# Patient Record
Sex: Female | Born: 1970 | Race: White | Hispanic: No | Marital: Married | Smoking: Never smoker
Health system: Southern US, Community
[De-identification: ages and names within clinical notes are randomized; demographics above are authoritative.]

## PROBLEM LIST (undated history)

## (undated) HISTORY — PX: SPLENECTOMY: SUR1306

## (undated) HISTORY — PX: TUBAL LIGATION: SHX77

## (undated) HISTORY — PX: BUNIONECTOMY: SHX129

---
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
## 2002-02-19 ENCOUNTER — Other Ambulatory Visit: Admission: RE | Admit: 2002-02-19 | Discharge: 2002-02-19 | Payer: Self-pay | Admitting: Obstetrics and Gynecology

## 2003-02-21 ENCOUNTER — Other Ambulatory Visit: Admission: RE | Admit: 2003-02-21 | Discharge: 2003-02-21 | Payer: Self-pay | Admitting: Obstetrics and Gynecology

## 2004-02-27 ENCOUNTER — Other Ambulatory Visit: Admission: RE | Admit: 2004-02-27 | Discharge: 2004-02-27 | Payer: Self-pay | Admitting: Obstetrics and Gynecology

## 2004-10-15 ENCOUNTER — Ambulatory Visit (HOSPITAL_COMMUNITY): Admission: RE | Admit: 2004-10-15 | Discharge: 2004-10-15 | Payer: Self-pay | Admitting: Obstetrics and Gynecology

## 2005-02-11 ENCOUNTER — Other Ambulatory Visit: Admission: RE | Admit: 2005-02-11 | Discharge: 2005-02-11 | Payer: Self-pay | Admitting: Obstetrics and Gynecology

## 2005-04-11 ENCOUNTER — Encounter (INDEPENDENT_AMBULATORY_CARE_PROVIDER_SITE_OTHER): Payer: Self-pay | Admitting: *Deleted

## 2005-04-11 ENCOUNTER — Ambulatory Visit (HOSPITAL_COMMUNITY): Admission: RE | Admit: 2005-04-11 | Discharge: 2005-04-11 | Payer: Self-pay | Admitting: Obstetrics and Gynecology

## 2006-02-13 ENCOUNTER — Other Ambulatory Visit: Admission: RE | Admit: 2006-02-13 | Discharge: 2006-02-13 | Payer: Self-pay | Admitting: Obstetrics and Gynecology

## 2008-12-15 ENCOUNTER — Ambulatory Visit: Payer: Self-pay | Admitting: Diagnostic Radiology

## 2008-12-15 ENCOUNTER — Emergency Department (HOSPITAL_BASED_OUTPATIENT_CLINIC_OR_DEPARTMENT_OTHER): Admission: EM | Admit: 2008-12-15 | Discharge: 2008-12-16 | Payer: Self-pay | Admitting: Emergency Medicine

## 2009-10-11 IMAGING — CT CT HEAD W/O CM
1 series · 16 of 30 positions shown, 20 images · non-contrast
Comparison: None

CLINICAL DATA: Headache.  Syncope.  Nausea.

CT HEAD WITHOUT CONTRAST
TECHNIQUE: Contiguous axial images were obtained from the base of
the skull through the vertex without contrast

[Series 2: head 4.8 h37s · axial · 0.42mm/px · z∈[+1026,+1163]mm · 16 of 32 slices shown, 20 images]
[im 2/32  brain]
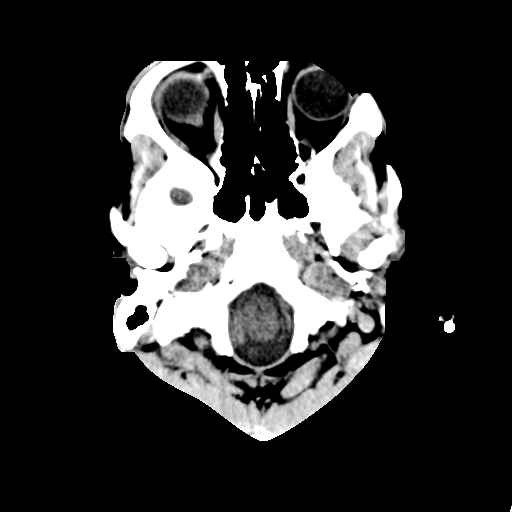
[im 2/32  bone]
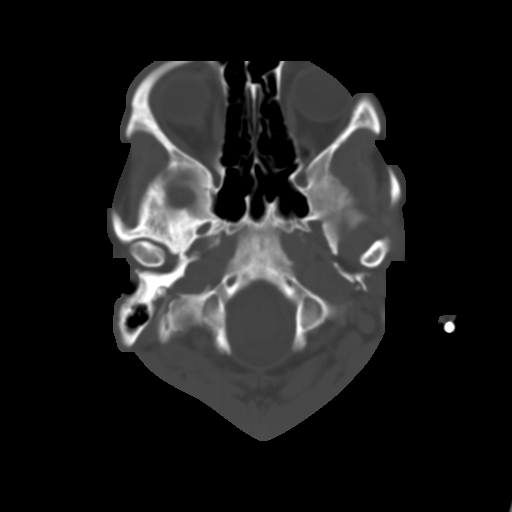
[im 4/32  brain]
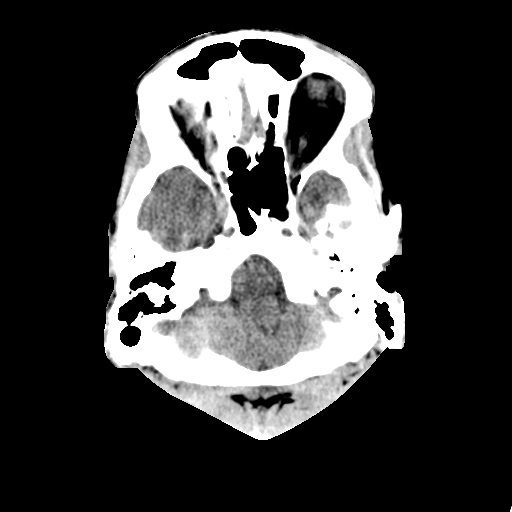
[im 6/32  brain]
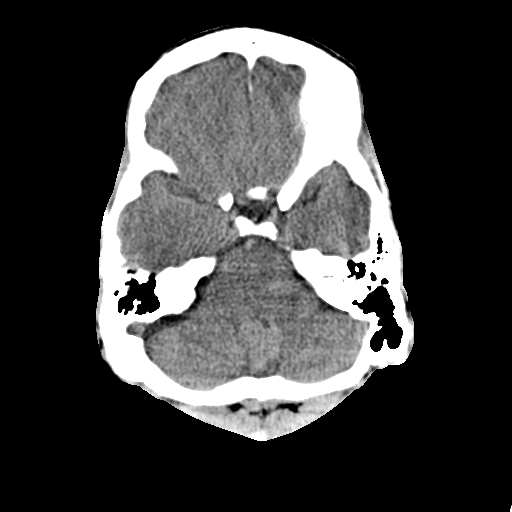
[im 8/32  brain]
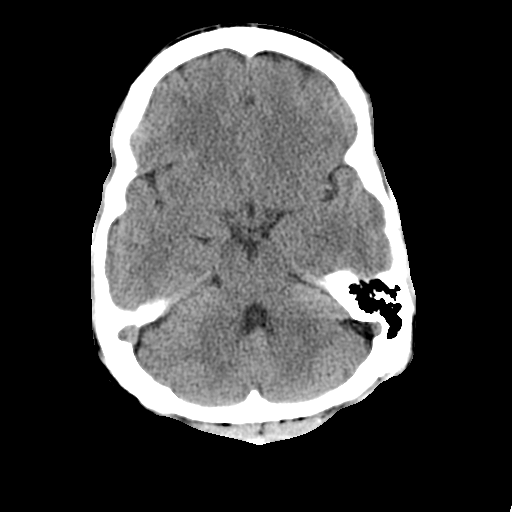
[im 9/32  brain]
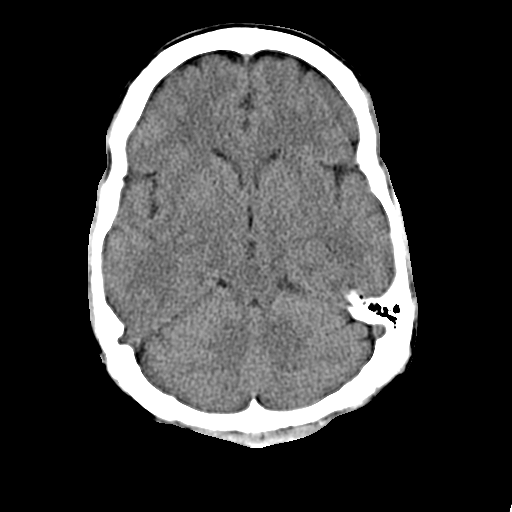
[im 9/32  bone]
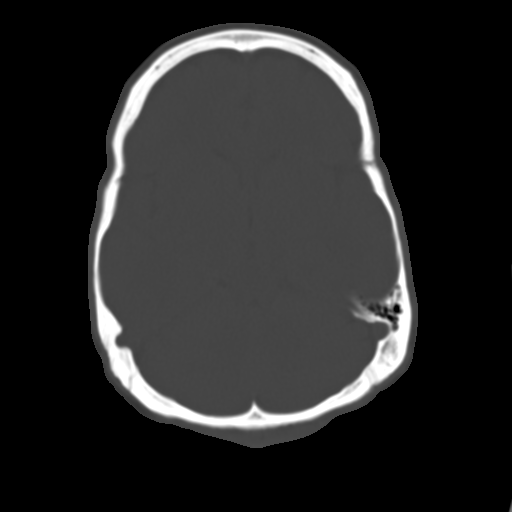
[im 11/32  brain]
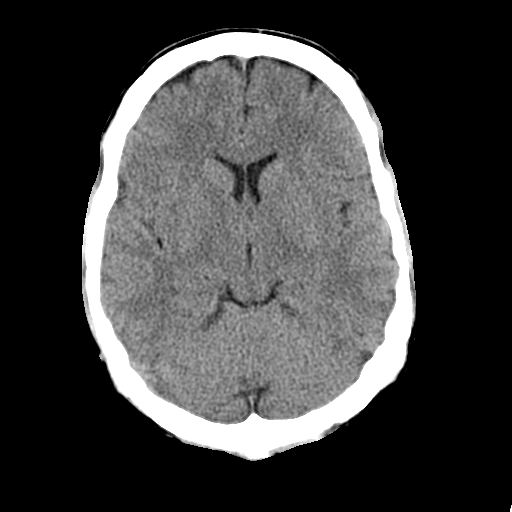
[im 13/32  brain]
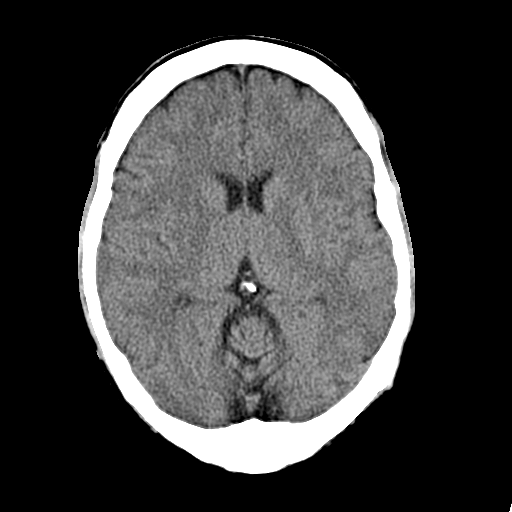
[im 15/32  brain]
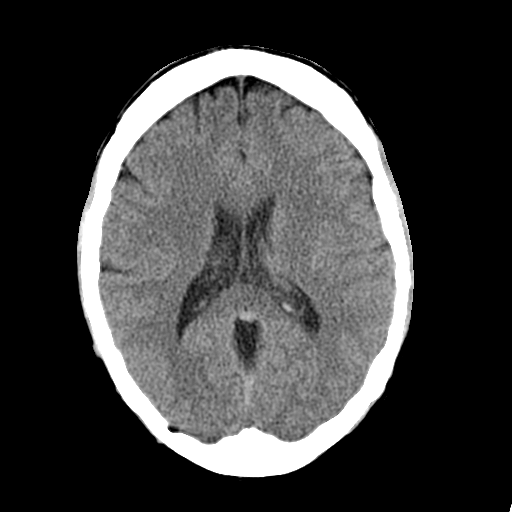
[im 17/32  brain]
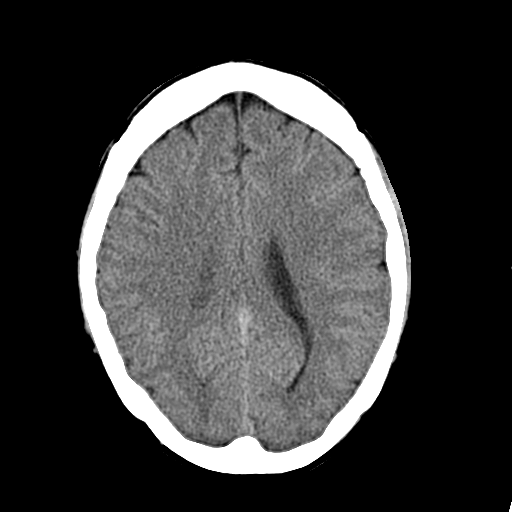
[im 17/32  bone]
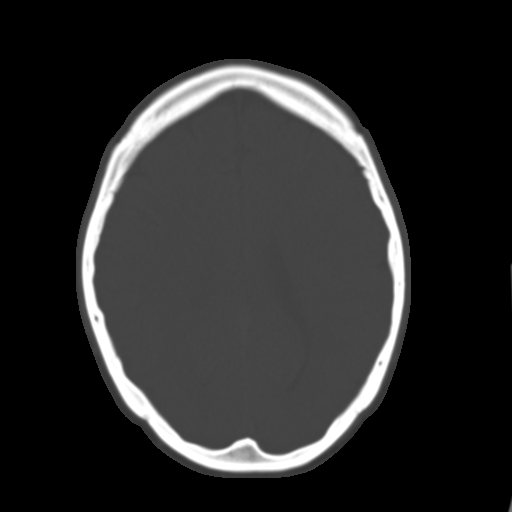
[im 19/32  brain]
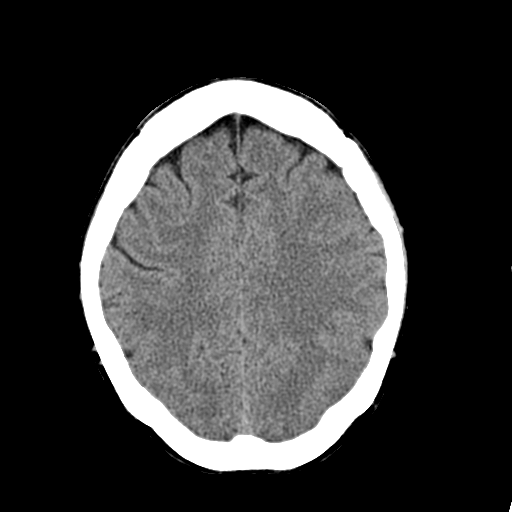
[im 21/32  brain]
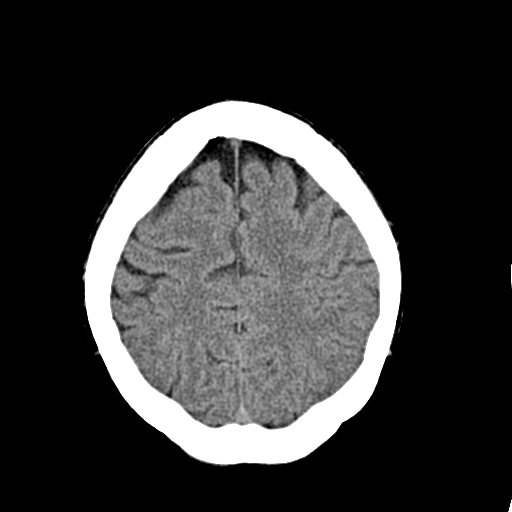
[im 23/32  brain]
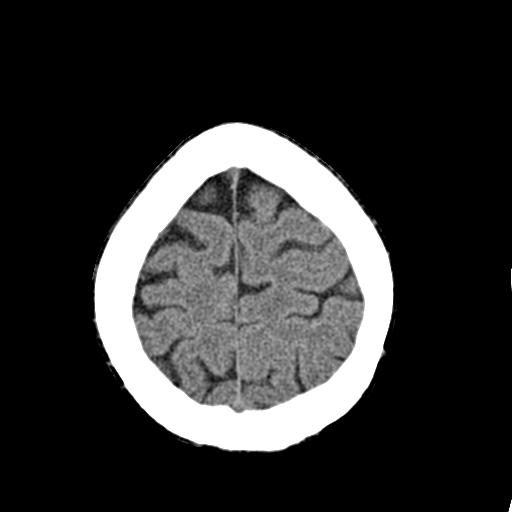
[im 24/32  brain]
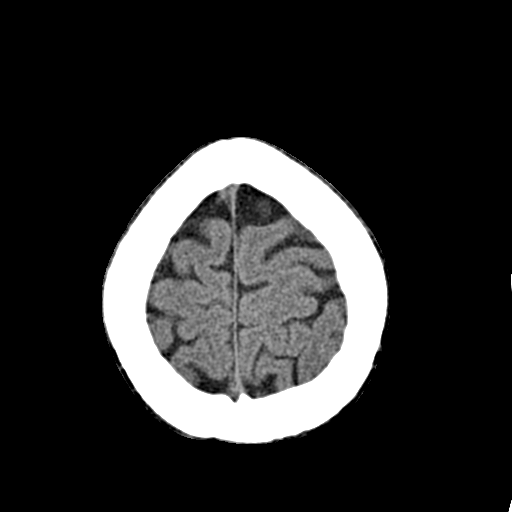
[im 24/32  bone]
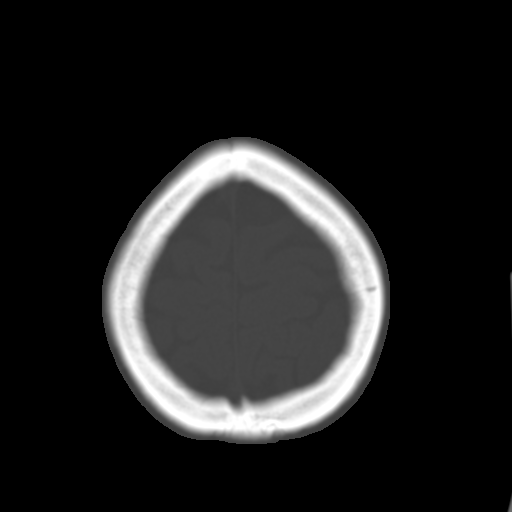
[im 26/32  brain]
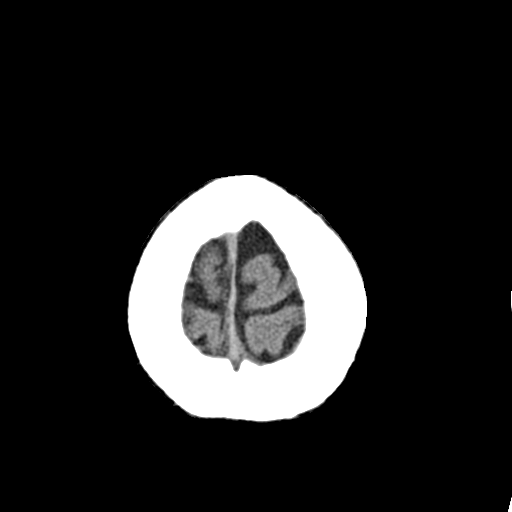
[im 28/32  brain]
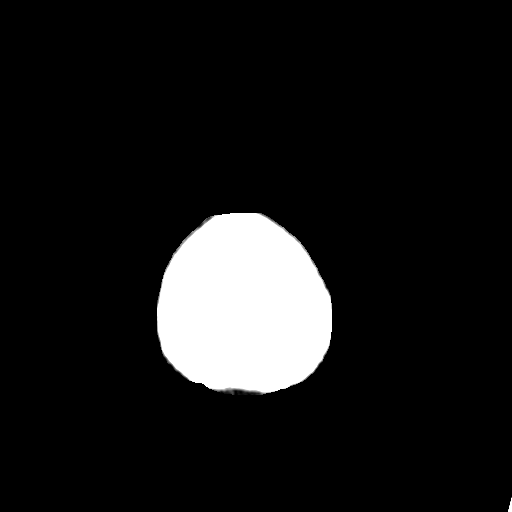
[im 30/32  brain]
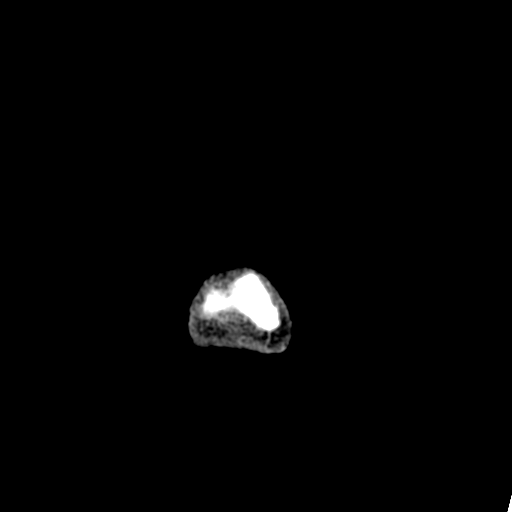

[16 of 30 positions shown; findings below may reference images not displayed]

FINDINGS: There is no evidence of intracranial hemorrhage, brain
edema, or other signs of acute infarction.  There is no evidence of
intracranial mass lesions, or mass effect.  No abnormal extraaxial
fluid collections are identified.  There is no evidence of
hydrocephalus, or other significant intracranial abnormality.  No
skull abnormality identified.
IMPRESSION: Negative non-contrast head CT.

## 2011-02-12 LAB — DIFFERENTIAL
Basophils Absolute: 0.1 10*3/uL (ref 0.0–0.1)
Basophils Relative: 1 % (ref 0–1)
Eosinophils Absolute: 0.1 10*3/uL (ref 0.0–0.7)
Eosinophils Relative: 1 % (ref 0–5)
Lymphocytes Relative: 4 % — ABNORMAL LOW (ref 12–46)
Lymphs Abs: 0.4 10*3/uL — ABNORMAL LOW (ref 0.7–4.0)
Monocytes Absolute: 0.7 10*3/uL (ref 0.1–1.0)
Monocytes Relative: 6 % (ref 3–12)
Neutro Abs: 10.3 10*3/uL — ABNORMAL HIGH (ref 1.7–7.7)
Neutrophils Relative %: 89 % — ABNORMAL HIGH (ref 43–77)

## 2011-02-12 LAB — BASIC METABOLIC PANEL
BUN: 18 mg/dL (ref 6–23)
CO2: 28 mEq/L (ref 19–32)
Calcium: 9.2 mg/dL (ref 8.4–10.5)
Chloride: 99 mEq/L (ref 96–112)
Creatinine, Ser: 0.9 mg/dL (ref 0.4–1.2)
GFR calc Af Amer: >60 mL/min (ref >60)
GFR calc non Af Amer: >60 mL/min (ref >60)
Glucose, Bld: 110 mg/dL — ABNORMAL HIGH (ref 70–99)
Potassium: 3.7 mEq/L (ref 3.5–5.1)
Sodium: 138 mEq/L (ref 135–145)

## 2011-02-12 LAB — URINE MICROSCOPIC-ADD ON

## 2011-02-12 LAB — CBC
HCT: 43.2 % (ref 36.0–46.0)
Hemoglobin: 14.3 g/dL (ref 12.0–15.0)
MCHC: 33.1 g/dL (ref 30.0–36.0)
MCV: 94.5 fL (ref 78.0–100.0)
Platelets: 329 10*3/uL (ref 150–400)
RBC: 4.57 MIL/uL (ref 3.87–5.11)
RDW: 12.7 % (ref 11.5–15.5)
WBC: 11.6 10*3/uL — ABNORMAL HIGH (ref 4.0–10.5)

## 2011-02-12 LAB — URINALYSIS, ROUTINE W REFLEX MICROSCOPIC
Bilirubin Urine: NEGATIVE
Glucose, UA: NEGATIVE mg/dL
Hgb urine dipstick: NEGATIVE
Ketones, ur: 15 mg/dL — AB
Leukocytes, UA: NEGATIVE
Nitrite: NEGATIVE
Protein, ur: 100 mg/dL — AB
Specific Gravity, Urine: 1.029 (ref 1.005–1.030)
Urobilinogen, UA: 1 mg/dL (ref 0.0–1.0)
pH: 7 (ref 5.0–8.0)

## 2011-02-12 LAB — PREGNANCY, URINE: Preg Test, Ur: NEGATIVE

## 2011-03-15 NOTE — Op Note (Signed)
Adrienne Castro, Adrienne Castro          ACCOUNT NO.:  [id number]   MEDICAL RECORD NO.:  [id number]          PATIENT TYPE:  AMB   LOCATION:  [facility]                           FACILITY:  [facility]   PHYSICIAN:  Sandra A. Rivard, M.D. DATE OF BIRTH:  09/17/1971   DATE OF PROCEDURE:  04/11/2005  DATE OF DISCHARGE:                                 OPERATIVE REPORT   PREOPERATIVE DIAGNOSES:  1.  Desire for sterilization.  2.  Metrorrhagia.   POSTOPERATIVE DIAGNOSES:  1.  Desire for sterilization.  2.  Metrorrhagia.   ANESTHESIA:  General, Dr. Jean Rosenthal.   PROCEDURES:  1.  Diagnostic hysteroscopy.  2.  Dilatation and curettage.  3.  Laparoscopy.  4.  Bilateral tubal ligation.   SURGEON:  Crist Fat. Rivard, M.D.   ESTIMATED BLOOD LOSS:  Minimal.   PROCEDURE:  After being informed of the planned procedure with possible  complications including bleeding, infection, uterine perforation, injury to  bowels, bladder or ureters, need for laparotomy, irreversibility of tubal  ligation as well as failure rate of one in 500, informed consent was  obtained.  The patient was taken to OR #3, given general anesthesia with  endotracheal intubation without complication.  The patient was placed in a  lithotomy position, prepped and draped in a sterile fashion, and her bladder  was emptied with an in-and-out Foley catheter.   GYN exam reveals normal external genitalia, normal cervix, anteverted  uterus, normal in size and shape, two normal adnexa.  A weighted speculum  was inserted, anterior lip of the cervix was grasped with a Jacobs forceps,  and we proceed with the paracervical block using 20 mL of Nesacaine 1%.  The  uterus is sounded at 9 cm and the cervix is easily dilated with Hegar  dilator until #25.  This allows easy entry of the diagnostic hysteroscope  and with the perfusion of sorbitol at 60 mmHg, are able to easily visualize  the whole endometrial cavity, the whole uterine cavity including the  tubal  ostia.  The endometrium appears atrophic.  No lesion is identified.  Hysteroscope was removed.  Fluid deficit is 0.  We proceed with a sharp  curettage of the endometrial cavity, which removes a minimal amount of  normal-appearing endometrium.  An acorn manipulator is then inserted,  weighted speculum is removed, and we proceed with the laparoscopy.   The umbilical area is infiltrated with 8 mL of Marcaine 0.25%, and we  perform a semielliptical incision to the fascia.  Fascia is grasped with two  hemostat forceps, incised with a knife, extended with scissors, and the  peritoneum is entered bluntly.  A Hasson trocar is placed after a  pursestring suture of 0 Vicryl is placed on the fascia to hold the trocar in  place.  We proceed with insufflation of CO2 at a maximum pressure of 15 mmHg  to achieve an adequate pneumoperitoneum.  Operative laparoscope was  inserted.   Observation:  Anterior cul-de-sac is normal, uterus is normal, posterior cul-  de-sac is normal.  The tubes are easily identified until the fimbrial end  and are normal, and the two  ovaries are normal.  There are no adhesions in  her pelvis or her abdomen.  Liver edge is normal.  Appendix is normal.   We proceed with a tubal ligation using bipolar cauterization, cauterizing a  section of 1.5 cm of each tube in that the isthmic-ampullary region  including the mesosalpinx.  Blanching is complete.  Instruments are removed.  Pneumoperitoneum is evacuated.   The previously pursestring suture is tied after assessing no bowel loop had  protruded into the incision.  Skin is closed with a subcuticular suture of 4-0 Monocryl and Steri-Strips.   Instrument and sponge count is complete x2.  Estimated blood loss is  minimal.  The procedure is very well-tolerated by the patient, who is taken  to recovery room in a well and stable condition.       SAR/MEDQ  D:  04/11/2005  T:  04/11/2005  Job:  [id number]

## 2013-03-23 ENCOUNTER — Other Ambulatory Visit: Payer: Self-pay | Admitting: Obstetrics and Gynecology

## 2013-03-23 DIAGNOSIS — Z1231 Encounter for screening mammogram for malignant neoplasm of breast: Secondary | ICD-10-CM

## 2013-04-02 ENCOUNTER — Ambulatory Visit
Admission: RE | Admit: 2013-04-02 | Discharge: 2013-04-02 | Disposition: A | Discharge status: Home / Self Care | Payer: Managed Care, Other (non HMO) | Source: Ambulatory Visit | Attending: Obstetrics and Gynecology | Admitting: Obstetrics and Gynecology

## 2013-04-02 DIAGNOSIS — Z1231 Encounter for screening mammogram for malignant neoplasm of breast: Secondary | ICD-10-CM

## 2017-12-08 ENCOUNTER — Ambulatory Visit (INDEPENDENT_AMBULATORY_CARE_PROVIDER_SITE_OTHER): Payer: BLUE CROSS/BLUE SHIELD | Admitting: Clinical

## 2017-12-08 DIAGNOSIS — F4323 Adjustment disorder with mixed anxiety and depressed mood: Secondary | ICD-10-CM | POA: Diagnosis not present

## 2017-12-23 ENCOUNTER — Ambulatory Visit (INDEPENDENT_AMBULATORY_CARE_PROVIDER_SITE_OTHER): Payer: BLUE CROSS/BLUE SHIELD | Admitting: Clinical

## 2017-12-23 DIAGNOSIS — F4323 Adjustment disorder with mixed anxiety and depressed mood: Secondary | ICD-10-CM | POA: Diagnosis not present

## 2017-12-26 ENCOUNTER — Other Ambulatory Visit: Payer: Self-pay

## 2017-12-26 ENCOUNTER — Encounter (HOSPITAL_COMMUNITY): Payer: Self-pay | Admitting: *Deleted

## 2017-12-29 ENCOUNTER — Other Ambulatory Visit: Payer: Self-pay | Admitting: Obstetrics and Gynecology

## 2018-01-08 NOTE — H&P (Signed)
Adrienne Castro is a 47 y.o. female  P: 2-0-1-2 who presents for placement of tension free vaginal tape because of stress urinary incontinence.  For over four years the patient has had worsening leaking of urine with coughing, sneezing, lifting and on occasion running.  She denies any urinary urgency or frequency and has not had any change in bowel function,  dyspareunia or pelvic pain.   Patient underwent Lumax (urodynamics) and was found to have a large bladder capacity,  stress urinary incontinence and intrinsic sphincter dysfunction.  A review of both medical and surgical management options were given to the patient,  however,  she has chosen to proceed with surgical management.     Past Medical History  OB History: G: 3;   P: 2-0-1-2;  SVB 1997 and 2000  (largest infant 9 lbs. 1 oz)   GYN History: menarche: 47 YO    LMP: 12/15/17    Contracepiton: Tubal Sterilization  Remote  history of abnormal PAP smear- age 47 treated with Cryotherapy and normal since.   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 Penicillins Anaphylaxis and Other (See Comments)    As a child - unknown reaction  . Nsaids Other (See Comments)    Patient is a potential kidney donor.  No NSAIDS  . Erythromycin Rash and Other (See Comments)    Rash on chest and face     Denies sensitivity to peanuts, shellfish, soy, latex or adhesives.   ROS: Admits to contact lenses but  Denies headache, vision changes, nasal congestion,  dysphagia, tinnitus, dizziness, hoarseness, cough,  chest pain, shortness of breath, nausea, vomiting, diarrhea,constipation,  urinary frequency, urgency  dysuria, hematuria, vaginitis symptoms, pelvic pain, swelling of joints,easy bruising,  myalgias, arthralgias, skin rashes, unexplained weight loss and except as is mentioned in the history of present illness, patient's review of systems is otherwise negative.   Physical Exam  Bp: 100/62  P: 70 bpm.   Temperature: 98.2 degrees F orally  Weight: 129 lbs.  Height: 5'4"  BMI: 22.1  Neck: supple without masses or thyromegaly Lungs: clear to auscultation Heart: regular rate and rhythm Abdomen: soft, non-tender and no organomegaly Pelvic:EGBUS- wnl; vagina-normal rugae with mild relaxation; uterus-normal size, cervix without lesions or motion tenderness; adnexae-no tenderness or masses Extremities:  no clubbing, cyanosis or edema   Assesment: Stress Urinary Incontinence   Disposition:  A discussion was held with patient regarding the indication for her procedure(s) along with the risks, which include but are not limited to: reaction to anesthesia, damage to adjacent organs, infection , erosion of tension free vaginal tape, worsening of symptoms or urinary retention and excessive bleeding. The patient verbalized understanding of these risks and has consented to proceed with Placement of Tension Free Vaginal Tape and [facility] on January 13, 2018  CSN# [id number]   Sreenidhi Ganson J. Lowell GuitarPowell, PA-C  for Dr. Woodroe ModeAngela Y. Su Hiltoberts

## 2018-01-09 ENCOUNTER — Ambulatory Visit (INDEPENDENT_AMBULATORY_CARE_PROVIDER_SITE_OTHER): Payer: BLUE CROSS/BLUE SHIELD | Admitting: Clinical

## 2018-01-09 DIAGNOSIS — F4323 Adjustment disorder with mixed anxiety and depressed mood: Secondary | ICD-10-CM

## 2018-01-13 ENCOUNTER — Observation Stay (HOSPITAL_COMMUNITY)
Admission: AD | Admit: 2018-01-13 | Discharge: 2018-01-14 | Disposition: A | Discharge status: Home / Self Care | Payer: BLUE CROSS/BLUE SHIELD | Source: Ambulatory Visit | Attending: Obstetrics and Gynecology | Admitting: Obstetrics and Gynecology

## 2018-01-13 ENCOUNTER — Encounter (HOSPITAL_COMMUNITY)
Admission: AD | Disposition: A | Discharge status: Home / Self Care | Payer: Self-pay | Source: Ambulatory Visit | Attending: Obstetrics and Gynecology

## 2018-01-13 ENCOUNTER — Ambulatory Visit (HOSPITAL_COMMUNITY): Payer: BLUE CROSS/BLUE SHIELD | Admitting: Anesthesiology

## 2018-01-13 ENCOUNTER — Encounter (HOSPITAL_COMMUNITY): Payer: Self-pay

## 2018-01-13 ENCOUNTER — Other Ambulatory Visit: Payer: Self-pay

## 2018-01-13 DIAGNOSIS — N393 Stress incontinence (female) (male): Principal | ICD-10-CM | POA: Insufficient documentation

## 2018-01-13 DIAGNOSIS — R32 Unspecified urinary incontinence: Secondary | ICD-10-CM | POA: Diagnosis present

## 2018-01-13 HISTORY — PX: BLADDER SUSPENSION: SHX72

## 2018-01-13 HISTORY — PX: CYSTOSCOPY: SHX5120

## 2018-01-13 LAB — CBC
HEMATOCRIT: 32.6 % — AB (ref 36.0–46.0)
HEMOGLOBIN: 11 g/dL — AB (ref 12.0–15.0)
MCH: 30.1 pg (ref 26.0–34.0)
MCHC: 33.7 g/dL (ref 30.0–36.0)
MCV: 89.1 fL (ref 78.0–100.0)
PLATELETS: 414 10*3/uL — AB (ref 150–400)
RBC: 3.66 MIL/uL — AB (ref 3.87–5.11)
RDW: 16.4 % — ABNORMAL HIGH (ref 11.5–15.5)
WBC: 9.3 10*3/uL (ref 4.0–10.5)

## 2018-01-13 LAB — BASIC METABOLIC PANEL
Anion gap: 8 (ref 5–15)
BUN: 14 mg/dL (ref 6–20)
CHLORIDE: 105 mmol/L (ref 101–111)
CO2: 24 mmol/L (ref 22–32)
Calcium: 8.9 mg/dL (ref 8.9–10.3)
Creatinine, Ser: 0.69 mg/dL (ref 0.44–1.00)
GFR calc non Af Amer: >60 mL/min (ref >60)
Glucose, Bld: 86 mg/dL (ref 65–99)
POTASSIUM: 4 mmol/L (ref 3.5–5.1)
Sodium: 137 mmol/L (ref 135–145)

## 2018-01-13 LAB — PREGNANCY, URINE: Preg Test, Ur: NEGATIVE

## 2018-01-13 SURGERY — TRANSVAGINAL TAPE (TVT) PROCEDURE
Anesthesia: General | Site: Vagina

## 2018-01-13 MED ORDER — LACTATED RINGERS IV SOLN
INTRAVENOUS | Status: DC
  Administered 2018-01-13: 22:00:00 via INTRAVENOUS

## 2018-01-13 MED ORDER — SODIUM CHLORIDE 0.9 % IJ SOLN
INTRAMUSCULAR | Status: AC
  Filled 2018-01-13: qty 50

## 2018-01-13 MED ORDER — ONDANSETRON HCL 4 MG PO TABS: 4.0000 mg | ORAL_TABLET | Freq: Three times a day (TID) | ORAL | Status: DC | PRN

## 2018-01-13 MED ORDER — SCOPOLAMINE 1 MG/3DAYS TD PT72
MEDICATED_PATCH | TRANSDERMAL | Status: AC
  Filled 2018-01-13: qty 1

## 2018-01-13 MED ORDER — ESTRADIOL 0.1 MG/GM VA CREA
TOPICAL_CREAM | VAGINAL | Status: AC
  Filled 2018-01-13: qty 42.5

## 2018-01-13 MED ORDER — VASOPRESSIN 20 UNIT/ML IV SOLN
INTRAVENOUS | Status: DC | PRN
  Administered 2018-01-13: 20 mL via INTRAMUSCULAR

## 2018-01-13 MED ORDER — PROPOFOL 10 MG/ML IV BOLUS
INTRAVENOUS | Status: DC | PRN
  Administered 2018-01-13: 150 mg via INTRAVENOUS

## 2018-01-13 MED ORDER — ESTRADIOL 0.1 MG/GM VA CREA
TOPICAL_CREAM | VAGINAL | Status: DC | PRN
  Administered 2018-01-13: 1 via VAGINAL

## 2018-01-13 MED ORDER — PHENYLEPHRINE 40 MCG/ML (10ML) SYRINGE FOR IV PUSH (FOR BLOOD PRESSURE SUPPORT)
PREFILLED_SYRINGE | INTRAVENOUS | Status: AC
  Filled 2018-01-13: qty 10

## 2018-01-13 MED ORDER — OXYCODONE-ACETAMINOPHEN 5-325 MG PO TABS
1.0000 | ORAL_TABLET | Freq: Four times a day (QID) | ORAL | Status: DC | PRN
  Administered 2018-01-13: 1 via ORAL
  Filled 2018-01-13: qty 1

## 2018-01-13 MED ORDER — ONDANSETRON HCL 4 MG/2ML IJ SOLN
INTRAMUSCULAR | Status: DC | PRN
  Administered 2018-01-13: 4 mg via INTRAVENOUS

## 2018-01-13 MED ORDER — MIDAZOLAM HCL 2 MG/2ML IJ SOLN
INTRAMUSCULAR | Status: DC | PRN
  Administered 2018-01-13: 2 mg via INTRAVENOUS

## 2018-01-13 MED ORDER — OXYCODONE HCL 5 MG PO TABS: 5.0000 mg | ORAL_TABLET | Freq: Once | ORAL | Status: DC | PRN

## 2018-01-13 MED ORDER — DEXAMETHASONE SODIUM PHOSPHATE 10 MG/ML IJ SOLN
INTRAMUSCULAR | Status: DC | PRN
  Administered 2018-01-13: 4 mg via INTRAVENOUS

## 2018-01-13 MED ORDER — LACTATED RINGERS IV SOLN
INTRAVENOUS | Status: DC
  Administered 2018-01-13 (×2): via INTRAVENOUS

## 2018-01-13 MED ORDER — DOCUSATE SODIUM 100 MG PO CAPS
100.0000 mg | ORAL_CAPSULE | Freq: Two times a day (BID) | ORAL | Status: DC
Start: 2018-01-13 — End: 2018-01-14
  Administered 2018-01-13: 100 mg via ORAL
  Filled 2018-01-13: qty 1

## 2018-01-13 MED ORDER — MENTHOL 3 MG MT LOZG: 1.0000 | LOZENGE | OROMUCOSAL | Status: DC | PRN

## 2018-01-13 MED ORDER — PROPOFOL 10 MG/ML IV BOLUS
INTRAVENOUS | Status: AC
  Filled 2018-01-13: qty 20

## 2018-01-13 MED ORDER — DEXAMETHASONE SODIUM PHOSPHATE 4 MG/ML IJ SOLN
INTRAMUSCULAR | Status: AC
  Filled 2018-01-13: qty 1

## 2018-01-13 MED ORDER — ONDANSETRON HCL 4 MG/2ML IJ SOLN: 4.0000 mg | Freq: Four times a day (QID) | INTRAMUSCULAR | Status: DC | PRN

## 2018-01-13 MED ORDER — FENTANYL CITRATE (PF) 100 MCG/2ML IJ SOLN
INTRAMUSCULAR | Status: AC
  Filled 2018-01-13: qty 2

## 2018-01-13 MED ORDER — PHENYLEPHRINE HCL 10 MG/ML IJ SOLN
INTRAMUSCULAR | Status: DC | PRN
  Administered 2018-01-13 (×2): 80 ug via INTRAVENOUS

## 2018-01-13 MED ORDER — VASOPRESSIN 20 UNIT/ML IV SOLN
INTRAVENOUS | Status: AC
  Filled 2018-01-13: qty 1

## 2018-01-13 MED ORDER — FENTANYL CITRATE (PF) 100 MCG/2ML IJ SOLN: 25.0000 ug | INTRAMUSCULAR | Status: DC | PRN

## 2018-01-13 MED ORDER — STERILE WATER FOR IRRIGATION IR SOLN
Status: DC | PRN
  Administered 2018-01-13: 1000 mL via INTRAVESICAL

## 2018-01-13 MED ORDER — FENTANYL CITRATE (PF) 100 MCG/2ML IJ SOLN
INTRAMUSCULAR | Status: DC | PRN
  Administered 2018-01-13: 100 ug via INTRAVENOUS
  Administered 2018-01-13 (×2): 50 ug via INTRAVENOUS

## 2018-01-13 MED ORDER — ROCURONIUM BROMIDE 100 MG/10ML IV SOLN
INTRAVENOUS | Status: AC
  Filled 2018-01-13: qty 1

## 2018-01-13 MED ORDER — LIDOCAINE HCL (CARDIAC) 20 MG/ML IV SOLN
INTRAVENOUS | Status: DC | PRN
  Administered 2018-01-13: 80 mg via INTRAVENOUS

## 2018-01-13 MED ORDER — OXYCODONE HCL 5 MG/5ML PO SOLN: 5.0000 mg | Freq: Once | ORAL | Status: DC | PRN

## 2018-01-13 MED ORDER — DEXTROSE 5 % IV SOLN
INTRAVENOUS | Status: AC
  Administered 2018-01-13: 113 mL via INTRAVENOUS
  Filled 2018-01-13: qty 7

## 2018-01-13 MED ORDER — MIDAZOLAM HCL 2 MG/2ML IJ SOLN
INTRAMUSCULAR | Status: AC
  Filled 2018-01-13: qty 2

## 2018-01-13 MED ORDER — SCOPOLAMINE 1 MG/3DAYS TD PT72
1.0000 | MEDICATED_PATCH | Freq: Once | TRANSDERMAL | Status: DC
  Administered 2018-01-13: 1.5 mg via TRANSDERMAL

## 2018-01-13 MED ORDER — EPHEDRINE 5 MG/ML INJ
INTRAVENOUS | Status: AC
  Filled 2018-01-13: qty 10

## 2018-01-13 MED ORDER — LIDOCAINE HCL (CARDIAC) 20 MG/ML IV SOLN
INTRAVENOUS | Status: AC
  Filled 2018-01-13: qty 5

## 2018-01-13 MED ORDER — FENTANYL CITRATE (PF) 250 MCG/5ML IJ SOLN
INTRAMUSCULAR | Status: AC
  Filled 2018-01-13: qty 5

## 2018-01-13 MED ORDER — ONDANSETRON HCL 4 MG/2ML IJ SOLN
INTRAMUSCULAR | Status: AC
  Filled 2018-01-13: qty 2

## 2018-01-13 MED ORDER — EPHEDRINE SULFATE 50 MG/ML IJ SOLN
INTRAMUSCULAR | Status: DC | PRN
  Administered 2018-01-13 (×2): 5 mg via INTRAVENOUS

## 2018-01-13 SURGICAL SUPPLY — 30 items
BLADE SURG 11 STRL SS (BLADE) ×4 IMPLANT
BLADE SURG 15 STRL LF C SS BP (BLADE) ×2 IMPLANT
BLADE SURG 15 STRL SS (BLADE) ×2
CANISTER SUCT 3000ML PPV (MISCELLANEOUS) ×4 IMPLANT
CATH FOLEY 2WAY SLVR  5CC 18FR (CATHETERS) ×2
CATH FOLEY 2WAY SLVR 5CC 18FR (CATHETERS) ×2 IMPLANT
DECANTER SPIKE VIAL GLASS SM (MISCELLANEOUS) ×4 IMPLANT
DERMABOND ADVANCED (GAUZE/BANDAGES/DRESSINGS) ×2
DERMABOND ADVANCED .7 DNX12 (GAUZE/BANDAGES/DRESSINGS) ×2 IMPLANT
GAUZE PACKING 2X5 YD STRL (GAUZE/BANDAGES/DRESSINGS) ×4 IMPLANT
GLOVE BIO SURGEON STRL SZ7.5 (GLOVE) ×4 IMPLANT
GLOVE BIOGEL PI IND STRL 7.0 (GLOVE) ×2 IMPLANT
GLOVE BIOGEL PI IND STRL 7.5 (GLOVE) ×4 IMPLANT
GLOVE BIOGEL PI INDICATOR 7.0 (GLOVE) ×2
GLOVE BIOGEL PI INDICATOR 7.5 (GLOVE) ×4
GOWN STRL REUS W/TWL LRG LVL3 (GOWN DISPOSABLE) ×8 IMPLANT
NEEDLE HYPO 22GX1.5 SAFETY (NEEDLE) ×4 IMPLANT
NS IRRIG 1000ML POUR BTL (IV SOLUTION) ×4 IMPLANT
PACK VAGINAL WOMENS (CUSTOM PROCEDURE TRAY) ×4 IMPLANT
SET CYSTO W/LG BORE CLAMP LF (SET/KITS/TRAYS/PACK) ×4 IMPLANT
SLING TRANS VAGINAL TAPE (Sling) ×2 IMPLANT
SLING UTERINE/ABD GYNECARE TVT (Sling) ×2 IMPLANT
SUT MNCRL AB 3-0 PS2 27 (SUTURE) IMPLANT
SUT MNCRL AB 4-0 PS2 18 (SUTURE) IMPLANT
SUT VIC AB 0 CT1 27 (SUTURE) ×2
SUT VIC AB 0 CT1 27XBRD ANBCTR (SUTURE) ×2 IMPLANT
SUT VIC AB 2-0 SH 27 (SUTURE) ×8
SUT VIC AB 2-0 SH 27XBRD (SUTURE) ×8 IMPLANT
TOWEL OR 17X24 6PK STRL BLUE (TOWEL DISPOSABLE) ×8 IMPLANT
TRAY FOLEY CATH SILVER 14FR (SET/KITS/TRAYS/PACK) ×4 IMPLANT

## 2018-01-13 NOTE — Anesthesia Procedure Notes (Signed)
Procedure Name: LMA Insertion Date/Time: 01/13/2018 1:34 PM Performed by: Armanda HeritageSterling, Nakema Fake M, CRNA Pre-anesthesia Checklist: Patient identified, Emergency Drugs available, Suction available, Patient being monitored and Timeout performed Patient Re-evaluated:Patient Re-evaluated prior to induction Oxygen Delivery Method: Circle system utilized Preoxygenation: Pre-oxygenation with 100% oxygen Induction Type: IV induction LMA: LMA inserted LMA Size: 4.0 Tube size: 4.0 mm Number of attempts: 1 ETT to lip (cm): at lip. Dental Injury: Teeth and Oropharynx as per pre-operative assessment

## 2018-01-13 NOTE — Op Note (Signed)
Preop Diagnosis: SUI  Postop Diagnosis: SUI  Procedure:1.TVT 2. Cystoscopy  Fluids: 1300 cc  UOP: 150 cc  EBL: 75 cc  Complications:none  Procedure:The patient was taken to the operating room after the risks, benefits and alternatives were discussed with patient, the patient verbalized understanding and consent signed and witnessed. The patient was placed under general anesthesia and prepped and draped in the normal sterile fashion in the dorsal lithotomy position. A weighted speculum was placed in the patient's vagina and the anterior vaginal wall was injected with dilute pitressin at a concentration of 20 units of pitressin in a total of 50cc of normal saline.  An incision was made in the anterior wall of the vagina for approximately 1cm beneath the midurethra and the underlying tissue was dissected away from the anterior vaginal wall down to the level of the lower symphysis pubis bilaterally. Attention was then turned to the mons pubis where two 5 mm incisions were made 2 fingerbreadths from the midline. The transabdominal guide was then passed through the mons pubis incision on the patient's right down through the space of Retzius and out through the anterior vaginal wall after deflecting the rigid urethral catheter guide to the ipsilateral side. The same was done on the contralateral side. Cystoscopy was performed and no invadvertant bladder injury was noted. The bladder was drained with a Foley while deflecting the rigid urethral catheter guide to the patient's right and the mesh was attached to the transabdominal guide and elevated up through the space of Retzius and out through the incision on the mons pubis on the ipsilateral side. The same was done on the contralateral side. Cystoscopy was performed again and no inadvertant bladder injury was noted. The 2318 French Foley was left in the urethra and a large Tresa EndoKelly was placed between the urethra and the mesh in order to leave the mesh slack  beneath the midurethra. The mesh was then cut flush with the skin at the mons pubis incisions bilaterally. Cystoscopy was performed again and bilateral ureters were noted to efflux without difficulty. The bilateral incisions on the mons pubis were then cleaned and Dermabond applied. The anterior vaginal wall incision was repaired with 2-0 vicryl with interrupted stitches.  Vagina was packed with estrogen soaked vaginal packing.  Sponge, lap and needle count was correct.  The patient tolerated the procedure well and was returned to the recovery room in good condition.

## 2018-01-13 NOTE — Discharge Instructions (Signed)
Call [facility] @ [phone] if: ° °You have a temperature greater than or equal to 100.4 degrees Farenheit orally °You have pain that is not made better by the pain medication given and taken as directed °You have excessive bleeding or problems urinating ° °Take Colace (Docusate Sodium/Stool Softener) 100 mg 2-3 times daily while taking narcotic pain medicine to avoid constipation or until bowel movements are regular. ° °You may drive after 1 week °You may walk up steps ° °You may shower  °You may resume a regular diet °Keep incisions clean and dry ° °Do not lift over 15 pounds for 6 weeks °Avoid anything in vagina for 6 weeks (or until after your post-operative visit) ° °

## 2018-01-13 NOTE — Transfer of Care (Signed)
Immediate Anesthesia Transfer of Care Note  Patient: Daryl EasternKristina Simmer  Procedure(s) Performed: TRANSVAGINAL TAPE (TVT) PROCEDURE (N/A Vagina ) CYSTOSCOPY (Bilateral Bladder)  Patient Location: PACU  Anesthesia Type:General  Level of Consciousness: awake, alert  and oriented  Airway & Oxygen Therapy: Patient Spontanous Breathing and Patient connected to nasal cannula oxygen  Post-op Assessment: Report given to RN and Post -op Vital signs reviewed and stable  Post vital signs: Reviewed and stable  Last Vitals:  Vitals:   01/13/18 1222  BP: 119/85  Pulse: 75  Resp: 16  Temp: 36.8 C  SpO2: 100%    Last Pain:  Vitals:   01/13/18 1222  TempSrc: Oral      Patients Stated Pain Goal: 5 (01/13/18 1222)  Complications: No apparent anesthesia complications

## 2018-01-13 NOTE — Anesthesia Preprocedure Evaluation (Signed)
Anesthesia Evaluation  Patient identified by MRN, date of birth, ID band Patient awake    Reviewed: Allergy & Precautions, H&P , NPO status , Patient's Chart, lab work & pertinent test results  Airway Mallampati: II   Neck ROM: full    Dental   Pulmonary neg pulmonary ROS,    breath sounds clear to auscultation       Cardiovascular negative cardio ROS   Rhythm:regular Rate:Normal     Neuro/Psych    GI/Hepatic   Endo/Other    Renal/GU    Stress incontinence    Musculoskeletal   Abdominal   Peds  Hematology   Anesthesia Other Findings   Reproductive/Obstetrics                             Anesthesia Physical Anesthesia Plan  ASA: II  Anesthesia Plan: General   Post-op Pain Management:    Induction: Intravenous  PONV Risk Score and Plan: 3 and Ondansetron, Dexamethasone, Scopolamine patch - Pre-op and Midazolam  Airway Management Planned: LMA  Additional Equipment:   Intra-op Plan:   Post-operative Plan:   Informed Consent: I have reviewed the patients History and Physical, chart, labs and discussed the procedure including the risks, benefits and alternatives for the proposed anesthesia with the patient or authorized representative who has indicated his/her understanding and acceptance.     Plan Discussed with: CRNA, Anesthesiologist and Surgeon  Anesthesia Plan Comments:         Anesthesia Quick Evaluation

## 2018-01-13 NOTE — Interval H&P Note (Signed)
History and Physical Interval Note:  01/13/2018 1:13 PM  Adrienne Castro  has presented today for surgery, with the diagnosis of Stress Incontinence  The various methods of treatment have been discussed with the patient and family. After consideration of risks, benefits and other options for treatment, the patient has consented to  Procedure(s): TRANSVAGINAL TAPE (TVT) PROCEDURE (N/A)/TENSION FREE VAGINAL TAPE CYSTOSCOPY (Bilateral) as a surgical intervention .  The patient's history has been reviewed, patient examined, no change in status, stable for surgery.  I have reviewed the patient's chart and labs.  Questions were answered to the patient's satisfaction.     Purcell NailsAngela Y Ellorie Kindall

## 2018-01-14 ENCOUNTER — Encounter (HOSPITAL_COMMUNITY): Payer: Self-pay | Admitting: Obstetrics and Gynecology

## 2018-01-14 DIAGNOSIS — N393 Stress incontinence (female) (male): Secondary | ICD-10-CM | POA: Diagnosis not present

## 2018-01-14 LAB — CBC
HCT: 27.2 % — ABNORMAL LOW (ref 36.0–46.0)
Hemoglobin: 9.3 g/dL — ABNORMAL LOW (ref 12.0–15.0)
MCH: 30.6 pg (ref 26.0–34.0)
MCHC: 34.2 g/dL (ref 30.0–36.0)
MCV: 89.5 fL (ref 78.0–100.0)
Platelets: 357 10*3/uL (ref 150–400)
RBC: 3.04 MIL/uL — AB (ref 3.87–5.11)
RDW: 16.4 % — AB (ref 11.5–15.5)
WBC: 15 10*3/uL — ABNORMAL HIGH (ref 4.0–10.5)

## 2018-01-14 LAB — BASIC METABOLIC PANEL
Anion gap: 7 (ref 5–15)
BUN: 12 mg/dL (ref 6–20)
CALCIUM: 8.3 mg/dL — AB (ref 8.9–10.3)
CO2: 25 mmol/L (ref 22–32)
CREATININE: 0.76 mg/dL (ref 0.44–1.00)
Chloride: 104 mmol/L (ref 101–111)
GFR calc Af Amer: >60 mL/min (ref >60)
GLUCOSE: 102 mg/dL — AB (ref 65–99)
Potassium: 4 mmol/L (ref 3.5–5.1)
SODIUM: 136 mmol/L (ref 135–145)

## 2018-01-14 MED ORDER — OXYCODONE-ACETAMINOPHEN 5-325 MG PO TABS: ORAL_TABLET | ORAL | 0 refills | Status: AC

## 2018-01-14 MED ORDER — CIPROFLOXACIN HCL 250 MG PO TABS: 250.0000 mg | ORAL_TABLET | Freq: Two times a day (BID) | ORAL | 0 refills | Status: AC

## 2018-01-14 NOTE — Progress Notes (Signed)
Adrienne Castro is a46 y.o.  [id number]  Post Op Date # 1:  Placement of Tension Free Vaginal Tape/Cystoscopy  Subjective: Patient is Doing well postoperatively. Patient has Pain is controlled with current analgesics. Medications being used: narcotic analgesics including Percocet 5/325 mg. Ambulating in the halls, tolerating a regular diet, passed flatus but hasn't voided since Foley removed about an hour ago.   Objective: Vital signs in last 24 hours: Temp:  [97.7 F (36.5 C)-98.8 F (37.1 C)] 98.5 F (36.9 C) (03/20 0320) Pulse Rate:  [73-85] 73 (03/20 0320) Resp:  [13-18] 16 (03/20 0320) BP: (90-125)/(48-85) 90/49 (03/20 0320) SpO2:  [99 %-100 %] 99 % (03/20 0320) Weight:  [130 lb (59 kg)] 130 lb (59 kg) (03/19 1222)  Intake/Output from previous day: 03/19 0701 - 03/20 0700 In: 2720 [P.O.:1320; I.V.:1400] Out: 2295 [Urine:2220] Intake/Output this shift: Total I/O In: 1080 [P.O.:1080] Out: 1920 [Urine:1920] Recent Labs  Lab 01/13/18 1220 01/14/18 0617  WBC 9.3 15.0*  HGB 11.0* 9.3*  HCT 32.6* 27.2*  PLT 414* 357     Recent Labs  Lab 01/13/18 1220  NA 137  K 4.0  CL 105  CO2 24  BUN 14  CREATININE 0.69  CALCIUM 8.9  GLUCOSE 86    EXAM: General: alert, cooperative and no distress Resp: clear to auscultation bilaterally Cardio: regular rate and rhythm, S1, S2 normal, no murmur, click, rub or gallop GI: BS present, soft and non-tender. Extremities: Homans sign is negative, no sign of DVT and no calf tenderness. Vaginal Bleeding: none   Assessment: s/p Procedure(s): TRANSVAGINAL TAPE (TVT) PROCEDURE CYSTOSCOPY: stable, progressing well, tolerating diet and anemia  Plan: Routine care-awaiting patient to void.  Consider discharge home today once the patient voids adequately.   LOS: 1 day    Henreitta LeberElmira Mackinzie Vuncannon, PA-C 01/14/2018 7:00 AM

## 2018-01-14 NOTE — Discharge Summary (Signed)
Physician Discharge Summary  Patient ID: Adrienne EasternKristina Castro MRN: [id number] DOB/AGE: 1971/09/15 47 y.o.  Admit date: 01/13/2018 Discharge date: 01/14/2018   Discharge Diagnoses: Female Urinary Incontinence Active Problems:   Incontinence of urine in female   Operation: Placement of Tension Free Vaginal Tape and Cystoscopy   Discharged Condition: Good  Hospital Course: On the date of admission the patient underwent the aforementioned procedures and tolerated them well.  Post operative course was unremarkable with the patient resuming bowel and bladder function by post operative day #1 and was therefore deemed ready for discharge home.  Discharge hemoglobin was 9.3.  Disposition: Home to Self Care  Discharge Medications:  Allergies as of 01/14/2018      Reactions   Penicillins Anaphylaxis, Other (See Comments)   As a child - unknown reaction   Nsaids Other (See Comments)   Patient is a potential kidney donor.  No NSAIDS   Erythromycin Rash, Other (See Comments)   Rash on chest and face      Medication List    TAKE these medications   CHILDRENS MULTIVITAMIN Chew Chew 1 each by mouth daily.   oxyCODONE-acetaminophen 5-325 MG tablet Commonly known as:  PERCOCET/ROXICET 1  po every 6 hours as needed for post operative pain       Cipro 250 mg  1  po every 12 hours for 3 days   Follow-up: Dr.  Woodroe ModeAngela Y. Su Hiltoberts on February 23, 2018 at 9:15 a.m.   Signed: Henreitta LeberElmira Powell, PA-C 01/14/2018, 7:07 AM

## 2018-01-14 NOTE — Progress Notes (Signed)
Pt 's packing was taken out . Had small amount of bloody drainage on gauze.  Pt tolerated well. Foley was taken out at this time.

## 2018-01-14 NOTE — Anesthesia Postprocedure Evaluation (Signed)
Anesthesia Post Note  Patient: Adrienne Castro  Procedure(s) Performed: TRANSVAGINAL TAPE (TVT) PROCEDURE (N/A Vagina ) CYSTOSCOPY (Bilateral Bladder)     Patient location during evaluation: PACU Anesthesia Type: General Level of consciousness: awake and alert Pain management: pain level controlled Vital Signs Assessment: post-procedure vital signs reviewed and stable Respiratory status: spontaneous breathing, nonlabored ventilation, respiratory function stable and patient connected to nasal cannula oxygen Cardiovascular status: blood pressure returned to baseline and stable Postop Assessment: no apparent nausea or vomiting Anesthetic complications: no    Last Vitals:  Vitals:   01/14/18 0800 01/14/18 1200  BP: (!) 98/58 (!) 99/58  Pulse: 66 77  Resp: 18 18  Temp: 36.8 C 37.1 C  SpO2: 100% 100%    Last Pain:  Vitals:   01/14/18 1200  TempSrc: Oral  PainSc:    Pain Goal: Patients Stated Pain Goal: 3 (01/13/18 1620)               [name] S

## 2018-01-26 ENCOUNTER — Ambulatory Visit (INDEPENDENT_AMBULATORY_CARE_PROVIDER_SITE_OTHER): Payer: BLUE CROSS/BLUE SHIELD | Admitting: Clinical

## 2018-01-26 DIAGNOSIS — F4323 Adjustment disorder with mixed anxiety and depressed mood: Secondary | ICD-10-CM | POA: Diagnosis not present

## 2018-01-29 ENCOUNTER — Ambulatory Visit: Payer: Self-pay | Admitting: Podiatry

## 2018-02-06 ENCOUNTER — Encounter: Payer: Self-pay | Admitting: Podiatry

## 2018-02-06 ENCOUNTER — Ambulatory Visit (INDEPENDENT_AMBULATORY_CARE_PROVIDER_SITE_OTHER): Payer: BLUE CROSS/BLUE SHIELD | Admitting: Podiatry

## 2018-02-06 DIAGNOSIS — Q828 Other specified congenital malformations of skin: Secondary | ICD-10-CM | POA: Diagnosis not present

## 2018-02-06 DIAGNOSIS — M79672 Pain in left foot: Secondary | ICD-10-CM | POA: Diagnosis not present

## 2018-02-06 DIAGNOSIS — M79671 Pain in right foot: Secondary | ICD-10-CM | POA: Diagnosis not present

## 2018-02-06 NOTE — Patient Instructions (Signed)
Remove the bandage in 24 hours unless it becomes painful or burning then go ahead and remove it. Once removing, wash with soap and water. If there is any blistering, redness, drainage, please let me know  Have a great weekend  If was nice to meet you today. If you have any questions or any further concerns, please feel fee to give me a call. You can call our office at [phone] or please feel fee to send me a message through MyChart.

## 2018-02-06 NOTE — Progress Notes (Signed)
Subjective:    Patient ID: Adrienne Castro, female    DOB: [birth year], 47 y.o.   MRN: [id number]  HPI 47 year old female presents the office today for concerns of painful lesions to underneath her fourth toes bilaterally.  She states that this is been ongoing for some time and she has had no recent injury or trauma.  She denies any swelling or redness or any drainage or pus coming from the area.  She has no other concerns today.   Review of Systems  All other systems reviewed and are negative.  Past Medical History:  Diagnosis Date  . SVD (spontaneous vaginal delivery)    x 2    Past Surgical History:  Procedure Laterality Date  . abscess tonsil  1986  . BLADDER SUSPENSION N/A 01/13/2018   Procedure: TRANSVAGINAL TAPE (TVT) PROCEDURE;  Surgeon: Osborn Coho, MD;  Location: [facility];  Service: Gynecology;  Laterality: N/A;  . BUNIONECTOMY Right   . CYSTOSCOPY Bilateral 01/13/2018   Procedure: CYSTOSCOPY;  Surgeon: Osborn Coho, MD;  Location: [facility];  Service: Gynecology;  Laterality: Bilateral;  . SPLENECTOMY     at age 69  . TUBAL LIGATION       Current Outpatient Medications:  .  oxyCODONE-acetaminophen (PERCOCET/ROXICET) 5-325 MG tablet, 1  po every 6 hours as needed for post operative pain, Disp: 20 tablet, Rfl: 0 .  Pediatric Multiple Vit-C-FA (CHILDRENS MULTIVITAMIN) CHEW, Chew 1 each by mouth daily., Disp: , Rfl:   Allergies  Allergen Reactions  . Penicillins Anaphylaxis and Other (See Comments)    As a child - unknown reaction  . Nsaids Other (See Comments)    Patient is a potential kidney donor.  No NSAIDS  . Erythromycin Rash and Other (See Comments)    Rash on chest and face    Social History   Socioeconomic History  . Marital status: Married    Spouse name: Not on file  . Number of children: Not on file  . Years of education: Not on file  . Highest education level: Not on file  Occupational History  . Not on file  Social Needs  . Financial  resource strain: Not on file  . Food insecurity:    Worry: Not on file    Inability: Not on file  . Transportation needs:    Medical: Not on file    Non-medical: Not on file  Tobacco Use  . Smoking status: Never Smoker  . Smokeless tobacco: Never Used  Substance and Sexual Activity  . Alcohol use: Yes    Alcohol/week: 0.6 - 1.2 oz    Types: 1 - 2 Glasses of wine per week  . Drug use: No  . Sexual activity: Yes    Birth control/protection: Surgical  Lifestyle  . Physical activity:    Days per week: Not on file    Minutes per session: Not on file  . Stress: Not on file  Relationships  . Social connections:    Talks on phone: Not on file    Gets together: Not on file    Attends religious service: Not on file    Active member of club or organization: Not on file    Attends meetings of clubs or organizations: Not on file    Relationship status: Not on file  . Intimate partner violence:    Fear of current or ex partner: Not on file    Emotionally abused: Not on file    Physically abused: Not on file  Forced sexual activity: Not on file  Other Topics Concern  . Not on file  Social History Narrative  . Not on file         Objective:   Physical Exam  General:  NAD  Dermatological:  Hyperkeratotic lesion submetatarsal 4 bilaterally the right side worse than left.  Upon debridement there is no underlying ulceration, drainage or any signs of infection present today.  The right side did appear to be somewhat securing deeper.  Vascular: Dorsalis Pedis artery and Posterior Tibial artery pedal pulses are 2/4 bilateral with immedate capillary fill time. There is no pain with calf compression, swelling, warmth, erythema.   Neruologic: Grossly intact via light touch bilateral.  Protective threshold with Semmes Wienstein monofilament intact to all pedal sites bilateral.   Musculoskeletal: Tenderness hyperkeratotic lesions with no other area tenderness.  Range of motion intact.   5/5 in all groups tested bilateral.      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  The right side the area was local.  Area was cleaned with alcohol and a pad was placed followed by salicylic acid and a bandage.  Post procedure instructions were discussed.  Watch for signs or symptoms of infection.  At this point follow-up with me as needed or if there is not any resolution to let me know.  Ovid CurdMatthew Wagoner, DPM

## 2018-11-19 ENCOUNTER — Other Ambulatory Visit: Payer: Self-pay | Admitting: Obstetrics and Gynecology

## 2019-01-01 ENCOUNTER — Other Ambulatory Visit: Payer: Self-pay | Admitting: Obstetrics and Gynecology

## 2021-10-15 ENCOUNTER — Other Ambulatory Visit: Payer: Self-pay | Admitting: Orthopedic Surgery

## 2021-10-15 DIAGNOSIS — M545 Low back pain, unspecified: Secondary | ICD-10-CM

## 2022-08-05 LAB — COLOGUARD: COLOGUARD: NEGATIVE

## 2024-04-01 ENCOUNTER — Ambulatory Visit: Admitting: Clinical

## 2024-04-01 DIAGNOSIS — F4320 Adjustment disorder, unspecified: Secondary | ICD-10-CM | POA: Diagnosis not present
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
## 2024-04-16 ENCOUNTER — Ambulatory Visit: Admitting: Clinical

## 2024-04-29 ENCOUNTER — Ambulatory Visit: Admitting: Clinical

## 2024-04-29 DIAGNOSIS — F4320 Adjustment disorder, unspecified: Secondary | ICD-10-CM | POA: Diagnosis not present
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
## 2024-05-11 ENCOUNTER — Ambulatory Visit: Admitting: Clinical

## 2024-05-18 ENCOUNTER — Ambulatory Visit (INDEPENDENT_AMBULATORY_CARE_PROVIDER_SITE_OTHER): Admitting: Clinical

## 2024-05-18 DIAGNOSIS — F4320 Adjustment disorder, unspecified: Secondary | ICD-10-CM
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
## 2024-06-03 ENCOUNTER — Ambulatory Visit: Admitting: Clinical

## 2024-06-03 ENCOUNTER — Ambulatory Visit (INDEPENDENT_AMBULATORY_CARE_PROVIDER_SITE_OTHER): Admitting: Clinical

## 2024-06-03 DIAGNOSIS — F4322 Adjustment disorder with anxiety: Secondary | ICD-10-CM | POA: Diagnosis not present
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
## 2024-06-24 ENCOUNTER — Other Ambulatory Visit: Payer: Self-pay | Admitting: Obstetrics and Gynecology

## 2024-06-24 DIAGNOSIS — Z1231 Encounter for screening mammogram for malignant neoplasm of breast: Secondary | ICD-10-CM

## 2024-07-09 ENCOUNTER — Ambulatory Visit: Admitting: Clinical

## 2024-07-09 DIAGNOSIS — F4320 Adjustment disorder, unspecified: Secondary | ICD-10-CM | POA: Diagnosis not present
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
## 2024-07-23 ENCOUNTER — Ambulatory Visit: Admitting: Clinical

## 2024-08-03 ENCOUNTER — Ambulatory Visit: Admitting: Clinical

## 2024-08-04 ENCOUNTER — Ambulatory Visit (INDEPENDENT_AMBULATORY_CARE_PROVIDER_SITE_OTHER): Admitting: Clinical

## 2024-08-04 DIAGNOSIS — F4320 Adjustment disorder, unspecified: Secondary | ICD-10-CM | POA: Diagnosis not present
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
## 2024-08-20 ENCOUNTER — Ambulatory Visit: Admitting: Clinical

## 2024-08-20 DIAGNOSIS — F4320 Adjustment disorder, unspecified: Secondary | ICD-10-CM

## 2024-08-20 DIAGNOSIS — F4321 Adjustment disorder with depressed mood: Secondary | ICD-10-CM | POA: Diagnosis not present
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
## 2024-09-03 ENCOUNTER — Ambulatory Visit (INDEPENDENT_AMBULATORY_CARE_PROVIDER_SITE_OTHER): Admitting: Clinical

## 2024-09-03 DIAGNOSIS — F4321 Adjustment disorder with depressed mood: Secondary | ICD-10-CM | POA: Diagnosis not present
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
## 2024-09-17 ENCOUNTER — Ambulatory Visit: Admitting: Clinical

## 2024-09-17 DIAGNOSIS — F4321 Adjustment disorder with depressed mood: Secondary | ICD-10-CM | POA: Diagnosis not present
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
## 2024-10-15 ENCOUNTER — Ambulatory Visit: Admitting: Clinical

## 2024-10-15 DIAGNOSIS — F4321 Adjustment disorder with depressed mood: Secondary | ICD-10-CM | POA: Diagnosis not present
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
## 2024-11-09 ENCOUNTER — Ambulatory Visit: Admitting: Clinical

## 2024-11-09 DIAGNOSIS — F4321 Adjustment disorder with depressed mood: Secondary | ICD-10-CM | POA: Diagnosis not present
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
## 2024-11-24 ENCOUNTER — Ambulatory Visit: Admitting: Clinical

## 2024-11-24 DIAGNOSIS — F4321 Adjustment disorder with depressed mood: Secondary | ICD-10-CM | POA: Diagnosis not present
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
## 2024-12-08 ENCOUNTER — Ambulatory Visit: Admitting: Clinical

## 2024-12-21 ENCOUNTER — Ambulatory Visit: Admitting: Clinical

## 2025-01-10 ENCOUNTER — Ambulatory Visit: Admitting: Clinical
